# Patient Record
Sex: Male | Born: 1960 | Race: Black or African American | Hispanic: No
Health system: Southern US, Community
[De-identification: ages and names within clinical notes are randomized; demographics above are authoritative.]

## PROBLEM LIST (undated history)

## (undated) DIAGNOSIS — M75101 Unspecified rotator cuff tear or rupture of right shoulder, not specified as traumatic: Secondary | ICD-10-CM

## (undated) DIAGNOSIS — I7100 Dissection of unspecified site of aorta: Secondary | ICD-10-CM

## (undated) DIAGNOSIS — I509 Heart failure, unspecified: Secondary | ICD-10-CM

## (undated) DIAGNOSIS — E119 Type 2 diabetes mellitus without complications: Secondary | ICD-10-CM

## (undated) DIAGNOSIS — M12811 Other specific arthropathies, not elsewhere classified, right shoulder: Secondary | ICD-10-CM

## (undated) DIAGNOSIS — I10 Essential (primary) hypertension: Secondary | ICD-10-CM

## (undated) DIAGNOSIS — M75102 Unspecified rotator cuff tear or rupture of left shoulder, not specified as traumatic: Secondary | ICD-10-CM

## (undated) DIAGNOSIS — M5126 Other intervertebral disc displacement, lumbar region: Secondary | ICD-10-CM

## (undated) HISTORY — PX: CARDIAC SURGERY: SHX584

---

## 2021-01-06 ENCOUNTER — Emergency Department (HOSPITAL_COMMUNITY): Payer: Medicaid Other

## 2021-01-06 ENCOUNTER — Emergency Department (HOSPITAL_COMMUNITY)
Admission: EM | Admit: 2021-01-06 | Discharge: 2021-01-06 | Disposition: A | Discharge status: Home / Self Care | Payer: Medicaid Other | Attending: Emergency Medicine | Admitting: Emergency Medicine

## 2021-01-06 ENCOUNTER — Other Ambulatory Visit: Payer: Self-pay

## 2021-01-06 ENCOUNTER — Encounter (HOSPITAL_COMMUNITY): Payer: Self-pay

## 2021-01-06 DIAGNOSIS — E119 Type 2 diabetes mellitus without complications: Secondary | ICD-10-CM | POA: Insufficient documentation

## 2021-01-06 DIAGNOSIS — I110 Hypertensive heart disease with heart failure: Secondary | ICD-10-CM | POA: Diagnosis not present

## 2021-01-06 DIAGNOSIS — I509 Heart failure, unspecified: Secondary | ICD-10-CM | POA: Insufficient documentation

## 2021-01-06 DIAGNOSIS — M25511 Pain in right shoulder: Secondary | ICD-10-CM | POA: Diagnosis not present

## 2021-01-06 DIAGNOSIS — W19XXXA Unspecified fall, initial encounter: Secondary | ICD-10-CM | POA: Diagnosis not present

## 2021-01-06 HISTORY — DX: Other specific arthropathies, not elsewhere classified, right shoulder: M12.811

## 2021-01-06 HISTORY — DX: Heart failure, unspecified: I50.9

## 2021-01-06 HISTORY — DX: Essential (primary) hypertension: I10

## 2021-01-06 HISTORY — DX: Dissection of unspecified site of aorta: I71.00

## 2021-01-06 HISTORY — DX: Other intervertebral disc displacement, lumbar region: M51.26

## 2021-01-06 HISTORY — DX: Unspecified rotator cuff tear or rupture of right shoulder, not specified as traumatic: M75.102

## 2021-01-06 HISTORY — DX: Unspecified rotator cuff tear or rupture of right shoulder, not specified as traumatic: M75.101

## 2021-01-06 HISTORY — DX: Type 2 diabetes mellitus without complications: E11.9

## 2021-01-06 MED ORDER — METHOCARBAMOL 750 MG PO TABS: 750.0000 mg | ORAL_TABLET | Freq: Two times a day (BID) | ORAL | 0 refills | Status: AC | PRN

## 2021-01-06 MED ORDER — AMLODIPINE BESYLATE 5 MG PO TABS
10.0000 mg | ORAL_TABLET | Freq: Once | ORAL | Status: AC
  Administered 2021-01-06: 10 mg via ORAL
  Filled 2021-01-06: qty 2

## 2021-01-06 MED ORDER — CARVEDILOL 3.125 MG PO TABS
6.2500 mg | ORAL_TABLET | Freq: Once | ORAL | Status: AC
  Administered 2021-01-06: 6.25 mg via ORAL
  Filled 2021-01-06: qty 2

## 2021-01-06 MED ORDER — IBUPROFEN 200 MG PO TABS
600.0000 mg | ORAL_TABLET | Freq: Once | ORAL | Status: AC
  Administered 2021-01-06: 600 mg via ORAL
  Filled 2021-01-06: qty 3

## 2021-01-06 MED ORDER — DICLOFENAC SODIUM 1 % EX GEL: 4.0000 g | Freq: Four times a day (QID) | CUTANEOUS | 2 refills | Status: AC

## 2021-01-06 NOTE — ED Provider Notes (Signed)
[facility]-EMERGENCY DEPT Provider Note   CSN: [id number] Arrival date & time: 01/06/21  1217     History Chief Complaint  Patient presents with   Shoulder Pain    Dennis Henderson is a 60 y.o. male.  The history is provided by the patient.      Dennis Henderson is a 60 y.o. male, with a history of HTN, DM, rotator cuff tear bilaterally, presenting to the ED with right shoulder pain for the past month.  Patient states he fell about a month ago, was seen in the emergency department, had a clear x-ray of the humerus, but the shoulder was not imaged.  He endorses some recent worsening pain, aching, nonradiating, worse with movement of the right shoulder. He has tried Tylenol. He states he has known rotator cuff injury to the right shoulder.  Denies numbness, focal weakness, fever, chest pain, neck pain, shortness of breath, or any other complaints.   Past Medical History:  Diagnosis Date   Aortic dissection (HCC)    CHF (congestive heart failure) (HCC)    Diabetes mellitus without complication (HCC)    Hypertension    Lumbar herniated disc    Rotator cuff tear arthropathy of both shoulders     There are no problems to display for this patient.   Past Surgical History:  Procedure Laterality Date   CARDIAC SURGERY         History reviewed. No pertinent family history.     Home Medications Prior to Admission medications   Medication Sig Start Date End Date Taking? Authorizing Provider  diclofenac Sodium (VOLTAREN) 1 % GEL Apply 4 g topically 4 (four) times daily. 01/06/21  Yes Gyan Cambre C, PA-C  methocarbamol (ROBAXIN) 750 MG tablet Take 1 tablet (750 mg total) by mouth 2 (two) times daily as needed for muscle spasms (or muscle tightness). 01/06/21  Yes Nahdia Doucet C, PA-C    Allergies    Pork-derived products  Review of Systems   Review of Systems  Constitutional:  Negative for fever.  Respiratory:  Negative for shortness of breath.    Cardiovascular:  Negative for chest pain.  Musculoskeletal:  Positive for arthralgias. Negative for back pain and neck pain.  Neurological:  Negative for weakness and numbness.   Physical Exam Updated Vital Signs BP (!) 187/118 (BP Location: Left Arm)   Pulse 68   Temp 97.9 F (36.6 C) (Oral)   Resp 16   Ht 5\' 7"  (1.702 m)   Wt 81.2 kg   SpO2 99%   BMI 28.04 kg/m   Physical Exam Vitals and nursing note reviewed.  Constitutional:      General: He is not in acute distress.    Appearance: Normal appearance. He is well-developed. He is not diaphoretic.  HENT:     Head: Normocephalic and atraumatic.  Eyes:     Conjunctiva/sclera: Conjunctivae normal.  Cardiovascular:     Rate and Rhythm: Normal rate and regular rhythm.     Pulses:          Radial pulses are 2+ on the right side.  Pulmonary:     Effort: Pulmonary effort is normal.  Musculoskeletal:     Cervical back: Neck supple.     Comments: Pain and stiffness with range of motion of the right shoulder. He does have increased pain and difficulty with raising his right arm above his head.  He can touch the left shoulder with the right hand, though this is also accompanied with  pain and stiffness. No noted swelling, deformity, instability.  No other abnormalities noted in the upper extremity.  No tenderness, pain, swelling, or deformity in the neck or back.  Skin:    General: Skin is warm and dry.     Capillary Refill: Capillary refill takes less than 2 seconds.     Coloration: Skin is not pale.  Neurological:     Mental Status: He is alert.     Comments: Sensation grossly intact to light touch through each of the nerve distributions of the bilateral upper extremities. Abduction and adduction of the fingers intact against resistance. Grip strength equal bilaterally. Supination and pronation intact against resistance. Strength 5/5 through the cardinal directions of the bilateral wrists. Strength 5/5 with flexion and  extension of the bilateral elbows. Patient can touch the thumb to each one of the fingertips without difficulty.  Patient can hold the "OK" sign against resistance.  Psychiatric:        Behavior: Behavior normal.    ED Results / Procedures / Treatments   Labs (all labs ordered are listed, but only abnormal results are displayed) Labs Reviewed - No data to display  EKG None  Radiology DG Shoulder Right  Result Date: 01/06/2021 CLINICAL DATA:  Persistent right shoulder pain since fall a month ago. EXAM: RIGHT SHOULDER - 2+ VIEW COMPARISON:  None. FINDINGS: There is no evidence of fracture or dislocation. There is no evidence of arthropathy or other focal bone abnormality. Soft tissues are unremarkable. IMPRESSION: Negative. Electronically Signed   By: Obie Dredge M.D.   On: 01/06/2021 13:43    Procedures Procedures   Medications Ordered in ED Medications  ibuprofen (ADVIL) tablet 600 mg (has no administration in time range)  amLODipine (NORVASC) tablet 10 mg (10 mg Oral Given 01/06/21 1325)  carvedilol (COREG) tablet 6.25 mg (6.25 mg Oral Given 01/06/21 1325)    ED Course  I have reviewed the triage vital signs and the nursing notes.  Pertinent labs & imaging results that were available during my care of the patient were reviewed by me and considered in my medical decision making (see chart for details).    MDM Rules/Calculators/A&P                          Patient presents with worsened pain in the right shoulder following an injury.  Previously diagnosed rotator cuff injury as well. No findings concerning for acute vascular deficit or emergent neurologic dysfunction. X-ray reviewed and interpreted.  No acute abnormalities. The patient was given instructions for home care as well as return precautions. Patient voices understanding of these instructions, accepts the plan, and is comfortable with discharge.  Patient states he is visiting from [place].  We discussed him  obtaining orthopedic follow-up in [place], however, I did provide local follow-up as a precaution.  Patient states he has adequate blood pressure medication at home, however, requested doses of his medications while here in the ED.    Final Clinical Impression(s) / ED Diagnoses Final diagnoses:  Acute pain of right shoulder    Rx / DC Orders ED Discharge Orders          Ordered    methocarbamol (ROBAXIN) 750 MG tablet  2 times daily PRN        01/06/21 1352    diclofenac Sodium (VOLTAREN) 1 % GEL  4 times daily        01/06/21 1352  Anselm Pancoast, PA-C 01/06/21 1401    Alvira Monday, MD 01/06/21 2147

## 2021-01-06 NOTE — ED Triage Notes (Signed)
Pt reports falling about a month ago and hurt his right shoulder. Pt reports worsening pain in shoulder over the past month. Pt reports he has an X-ray of his arm back when he fell, but never got an X-ray of his shoulder.

## 2021-01-06 NOTE — Discharge Instructions (Addendum)
You have been seen today for a shoulder injury. There were no acute abnormalities on the x-rays, including no sign of fracture or dislocation, however, there could be injuries to the soft tissues, such as the ligaments or tendons that are not seen on xrays. There could also be what are called occult fractures that are small fractures not seen on xray. Antiinflammatory medications: Take 600 mg of ibuprofen every 6 hours or 440 mg (over the counter dose) to 500 mg (prescription dose) of naproxen every 12 hours for the next 3 days. After this time, these medications may be used as needed for pain. Take these medications with food to avoid upset stomach. Choose only one of these medications, do not take them together. Acetaminophen (generic for Tylenol): Should you continue to have additional pain while taking the ibuprofen or naproxen, you may add in acetaminophen as needed. Your daily total maximum amount of acetaminophen from all sources should be limited to 4000mg /day for persons without liver problems, or 2000mg /day for those with liver problems. Diclofenac gel: This is a topical anti-inflammatory medication and can be applied directly to the painful region.  Do not use on the face or genitals.  This medication may be used as an alternative to oral anti-inflammatory medications, such as ibuprofen or naproxen. Methocarbamol: Methocarbamol (generic for Robaxin) is a muscle relaxer and can help relieve stiff muscles or muscle spasms.  Do not drive or perform other dangerous activities while taking this medication as it can cause drowsiness as well as changes in reaction time and judgement. Ice: May apply ice to the area over the next 24 hours for 15 minutes at a time to reduce swelling. Elevation: Keep the extremity elevated as often as possible to reduce pain and inflammation. Support: Wear the sling for support and comfort. Wear this until pain resolves.  Be sure to rotate and move your shoulder throughout  the day out of the sling to avoid stiffness in the shoulder. Exercises: Start by performing these exercises a few times a week, increasing the frequency until you are performing them twice daily.  Follow up: Follow-up with an orthopedic specialist, ideally within the next 2 weeks for any further management.  Call to make an appointment. Return: Return to the ED for numbness, weakness, increasing pain, overall worsening symptoms, loss of function, or if symptoms are not improving, you have tried to follow up with the orthopedic specialist, and have been unable to do so.  For prescription assistance, may try using prescription discount sites or apps, such as goodrx.com or Good Rx smart phone app.

## 2021-01-14 ENCOUNTER — Emergency Department (HOSPITAL_COMMUNITY)
Admission: EM | Admit: 2021-01-14 | Discharge: 2021-01-14 | Disposition: A | Discharge status: Home / Self Care | Payer: Medicaid Other | Attending: Emergency Medicine | Admitting: Emergency Medicine

## 2021-01-14 ENCOUNTER — Emergency Department (HOSPITAL_COMMUNITY): Payer: Medicaid Other

## 2021-01-14 ENCOUNTER — Encounter (HOSPITAL_COMMUNITY): Payer: Self-pay

## 2021-01-14 ENCOUNTER — Other Ambulatory Visit: Payer: Self-pay

## 2021-01-14 DIAGNOSIS — Z794 Long term (current) use of insulin: Secondary | ICD-10-CM | POA: Insufficient documentation

## 2021-01-14 DIAGNOSIS — I509 Heart failure, unspecified: Secondary | ICD-10-CM | POA: Insufficient documentation

## 2021-01-14 DIAGNOSIS — R0789 Other chest pain: Secondary | ICD-10-CM | POA: Diagnosis not present

## 2021-01-14 DIAGNOSIS — R42 Dizziness and giddiness: Secondary | ICD-10-CM | POA: Diagnosis not present

## 2021-01-14 DIAGNOSIS — I110 Hypertensive heart disease with heart failure: Secondary | ICD-10-CM | POA: Diagnosis not present

## 2021-01-14 DIAGNOSIS — R0602 Shortness of breath: Secondary | ICD-10-CM | POA: Diagnosis not present

## 2021-01-14 DIAGNOSIS — E119 Type 2 diabetes mellitus without complications: Secondary | ICD-10-CM | POA: Diagnosis not present

## 2021-01-14 DIAGNOSIS — R61 Generalized hyperhidrosis: Secondary | ICD-10-CM | POA: Diagnosis not present

## 2021-01-14 DIAGNOSIS — R079 Chest pain, unspecified: Secondary | ICD-10-CM

## 2021-01-14 DIAGNOSIS — Z79899 Other long term (current) drug therapy: Secondary | ICD-10-CM | POA: Insufficient documentation

## 2021-01-14 DIAGNOSIS — R531 Weakness: Secondary | ICD-10-CM | POA: Insufficient documentation

## 2021-01-14 DIAGNOSIS — Z7984 Long term (current) use of oral hypoglycemic drugs: Secondary | ICD-10-CM | POA: Insufficient documentation

## 2021-01-14 DIAGNOSIS — I7102 Dissection of abdominal aorta: Secondary | ICD-10-CM | POA: Diagnosis not present

## 2021-01-14 LAB — CBC WITH DIFFERENTIAL/PLATELET
Abs Immature Granulocytes: 0.01 10*3/uL (ref 0.00–0.07)
Basophils Absolute: 0 10*3/uL (ref 0.0–0.1)
Basophils Relative: 0 %
Eosinophils Absolute: 0.2 10*3/uL (ref 0.0–0.5)
Eosinophils Relative: 2 %
HCT: 40.2 % (ref 39.0–52.0)
Hemoglobin: 13.7 g/dL (ref 13.0–17.0)
Immature Granulocytes: 0 %
Lymphocytes Relative: 19 %
Lymphs Abs: 1.3 10*3/uL (ref 0.7–4.0)
MCH: 31.4 pg (ref 26.0–34.0)
MCHC: 34.1 g/dL (ref 30.0–36.0)
MCV: 92 fL (ref 80.0–100.0)
Monocytes Absolute: 0.6 10*3/uL (ref 0.1–1.0)
Monocytes Relative: 8 %
Neutro Abs: 4.7 10*3/uL (ref 1.7–7.7)
Neutrophils Relative %: 71 %
Platelets: 254 10*3/uL (ref 150–400)
RBC: 4.37 MIL/uL (ref 4.22–5.81)
RDW: 12.6 % (ref 11.5–15.5)
WBC: 6.7 10*3/uL (ref 4.0–10.5)
nRBC: 0 % (ref 0.0–0.2)

## 2021-01-14 LAB — TROPONIN I (HIGH SENSITIVITY)
Troponin I (High Sensitivity): 9 ng/L (ref <18)
Troponin I (High Sensitivity): 9 ng/L (ref <18)
Troponin I (High Sensitivity): 10 ng/L (ref <18)

## 2021-01-14 LAB — COMPREHENSIVE METABOLIC PANEL
ALT: 16 U/L (ref 0–44)
AST: 16 U/L (ref 15–41)
Albumin: 3.3 g/dL — ABNORMAL LOW (ref 3.5–5.0)
Alkaline Phosphatase: 61 U/L (ref 38–126)
Anion gap: 6 (ref 5–15)
BUN: 10 mg/dL (ref 6–20)
CO2: 23 mmol/L (ref 22–32)
Calcium: 9.1 mg/dL (ref 8.9–10.3)
Chloride: 105 mmol/L (ref 98–111)
Creatinine, Ser: 1.21 mg/dL (ref 0.61–1.24)
GFR, Estimated: >60 mL/min (ref >60)
Glucose, Bld: 272 mg/dL — ABNORMAL HIGH (ref 70–99)
Potassium: 4.1 mmol/L (ref 3.5–5.1)
Sodium: 134 mmol/L — ABNORMAL LOW (ref 135–145)
Total Bilirubin: 1.1 mg/dL (ref 0.3–1.2)
Total Protein: 6.1 g/dL — ABNORMAL LOW (ref 6.5–8.1)

## 2021-01-14 MED ORDER — FENTANYL CITRATE (PF) 100 MCG/2ML IJ SOLN
50.0000 ug | Freq: Once | INTRAMUSCULAR | Status: AC
  Administered 2021-01-14: 50 ug via INTRAVENOUS
  Filled 2021-01-14: qty 2

## 2021-01-14 MED ORDER — INSULIN GLARGINE 100 UNIT/ML SOLOSTAR PEN
15.0000 [IU] | PEN_INJECTOR | Freq: Every day | SUBCUTANEOUS | 2 refills | Status: AC
  Filled 2021-01-14: qty 9, 60d supply, fill #0
  Filled 2021-01-17: qty 6, 40d supply, fill #0
  Filled 2021-01-17: qty 12, 80d supply, fill #0

## 2021-01-14 MED ORDER — IOHEXOL 350 MG/ML SOLN
100.0000 mL | Freq: Once | INTRAVENOUS | Status: AC | PRN
  Administered 2021-01-14: 100 mL via INTRAVENOUS

## 2021-01-14 NOTE — ED Provider Notes (Signed)
[facility]ENCY [facility] Provider Note   CSN: [id number] Arrival date & time: 01/14/21  1332     History No chief complaint on file.   Dennis GreenspanRodney Henderson is a 60 y.o. male who presents with concern for central chest pain that started 30 minutes prior to arrival in the emergency department when he was grilling at [place]out [place] in [place].  Pain does not radiate but does have significant associated shortness of breath.  Additionally his lightheadedness and generalized weakness as well as diaphoresis associated.  EMS was called and he was administered 324 of aspirin as well as 1 sublingual nitro with mild improvement in his pain.  I to my ischial exam he is sleeping in his bed, easily awoken by this provider with verbal stimuli.  History of aortic dissection with stent repair; patient does state that his current chest pain feels similar to when he initially had an aortic dissection though his vital signs are stable at this time.  I personally read this patient's medical records.  Additionally has recent diagnosis of diabetes mellitus on metformin and insulin, hypertension on Norvasc, and CHF.  Additionally chronically on Percocet for chronic back pain.  Denies any recent prolonged travel or immobilization, is not anticoagulated.  Denies any hormone replacement therapy.  HPI  HPI: A 60 year old patient with a history of treated diabetes and hypertension presents for evaluation of chest pain. Initial onset of pain was approximately 1-3 hours ago. The patient's chest pain is described as heaviness/pressure/tightness, is not worse with exertion and is relieved by nitroglycerin. The patient reports some diaphoresis. The patient's chest pain is middle- or left-sided, is not well-localized, is not sharp and does not radiate to the arms/jaw/neck. The patient does not complain of nausea. The patient has a family history of coronary artery disease in a first-degree relative with  onset less than age 58. The patient has no history of stroke, has no history of peripheral artery disease, has not smoked in the past 90 days, has no history of hypercholesterolemia and does not have an elevated BMI (>=30).   Past Medical History:  Diagnosis Date   Aortic dissection (HCC)    CHF (congestive heart failure) (HCC)    Diabetes mellitus without complication (HCC)    Hypertension    Lumbar herniated disc    Rotator cuff tear arthropathy of both shoulders     There are no problems to display for this patient.   Past Surgical History:  Procedure Laterality Date   CARDIAC SURGERY      No family history on file.     Home Medications Prior to Admission medications   Medication Sig Start Date End Date Taking? Authorizing Provider  amLODipine (NORVASC) 10 MG tablet Take 10 mg by mouth every morning. 10/27/20  Yes [provider]  brimonidine (ALPHAGAN) 0.2 % ophthalmic solution Place 1 drop into both eyes every morning.   Yes [provider]  carvedilol (COREG) 25 MG tablet Take 25 mg by mouth every morning. 12/26/20  Yes [provider]  diclofenac Sodium (VOLTAREN) 1 % GEL Apply 4 g topically 4 (four) times daily. Patient taking differently: Apply 4 g topically 3 (three) times daily as needed (pain). 01/06/21  Yes Joy, Shawn C, PA-C  insulin glargine (LANTUS) 100 unit/mL SOPN Inject 15 Units into the skin at bedtime.   Yes [provider]  insulin glargine (LANTUS) 100 unit/mL SOPN Inject 15 Units into the skin at bedtime. 01/14/21  Yes Aaima Gaddie, Lupe Carneyebekah  R, PA-C  metFORMIN (GLUCOPHAGE) 500 MG tablet Take 1,000 mg by mouth 2 (two) times daily. 12/23/20  Yes [provider]  methocarbamol (ROBAXIN) 750 MG tablet Take 1 tablet (750 mg total) by mouth 2 (two) times daily as needed for muscle spasms (or muscle tightness). Patient taking differently: Take 750 mg by mouth at bedtime. 01/06/21  Yes Joy, Shawn C, PA-C  Multiple Vitamin  (MULTIVITAMIN WITH MINERALS) TABS tablet Take 1 tablet by mouth every morning. Centrum   Yes [provider]  oxyCODONE-acetaminophen (PERCOCET) 10-325 MG tablet Take 1 tablet by mouth 3 (three) times daily as needed for pain. 12/26/20  Yes [provider]  timolol (TIMOPTIC) 0.5 % ophthalmic solution Place 1 drop into both eyes every morning.   Yes [provider]  zolpidem (AMBIEN) 10 MG tablet Take 10 mg by mouth at bedtime. 11/25/20  Yes [provider]   Allergies    Pork-derived products  Review of Systems   Review of Systems  Constitutional:  Positive for diaphoresis and fatigue.  HENT: Negative.    Eyes: Negative.   Respiratory:  Positive for chest tightness and shortness of breath.   Cardiovascular:  Positive for chest pain and palpitations. Negative for leg swelling.  Gastrointestinal: Negative.   Genitourinary: Negative.   Musculoskeletal: Negative.   Neurological:  Positive for dizziness, weakness, light-headedness and headaches. Negative for syncope.   Physical Exam Updated Vital Signs BP 136/85   Pulse 72   Temp 98.6 F (37 C) (Oral)   Resp 14   Ht 5\' 7"  (1.702 m)   Wt 81.2 kg   SpO2 97%   BMI 28.04 kg/m   Physical Exam Vitals and nursing note reviewed.  Constitutional:      Appearance: Normal appearance. He is not toxic-appearing or diaphoretic.  HENT:     Head: Normocephalic and atraumatic.     Nose: Nose normal.     Mouth/Throat:     Mouth: Mucous membranes are moist.     Pharynx: Oropharynx is clear. Uvula midline. No oropharyngeal exudate or posterior oropharyngeal erythema.     Tonsils: No tonsillar exudate.  Eyes:     General: Lids are normal. Vision grossly intact.        Right eye: No discharge.        Left eye: No discharge.     Extraocular Movements: Extraocular movements intact.     Conjunctiva/sclera: Conjunctivae normal.     Pupils: Pupils are equal, round, and reactive to light.  Neck:     Vascular: No  carotid bruit.     Trachea: Trachea and phonation normal.  Cardiovascular:     Rate and Rhythm: Normal rate. Rhythm regularly irregular.     Pulses: Normal pulses.     Heart sounds: Normal heart sounds. No murmur heard.    Comments: Bigeminy on the monitor Pulmonary:     Effort: Pulmonary effort is normal. No tachypnea, bradypnea, accessory muscle usage, prolonged expiration or respiratory distress.     Breath sounds: Normal breath sounds. No wheezing or rales.  Chest:     Chest wall: No mass, lacerations, deformity, swelling, tenderness, crepitus or edema.  Abdominal:     General: Bowel sounds are normal. There is no distension.     Palpations: Abdomen is soft.     Tenderness: There is no abdominal tenderness. There is no right CVA tenderness, left CVA tenderness, guarding or rebound.  Musculoskeletal:        General: No deformity.  Cervical back: Full passive range of motion without pain, normal range of motion and neck supple. No rigidity or crepitus. No pain with movement or spinous process tenderness.     Right lower leg: No edema.     Left lower leg: No edema.  Lymphadenopathy:     Cervical: No cervical adenopathy.  Skin:    General: Skin is warm and dry.     Capillary Refill: Capillary refill takes less than 2 seconds.  Neurological:     General: No focal deficit present.     Mental Status: He is alert and oriented to person, place, and time. Mental status is at baseline.     Sensory: Sensation is intact.     Motor: Motor function is intact.  Psychiatric:        Mood and Affect: Mood normal.    ED Results / Procedures / Treatments   Labs (all labs ordered are listed, but only abnormal results are displayed) Labs Reviewed  COMPREHENSIVE METABOLIC PANEL - Abnormal; Notable for the following components:      Result Value   Sodium 134 (*)    Glucose, Bld 272 (*)    Total Protein 6.1 (*)    Albumin 3.3 (*)    All other components within normal limits  CBC WITH  DIFFERENTIAL/PLATELET  TROPONIN I (HIGH SENSITIVITY)  TROPONIN I (HIGH SENSITIVITY)  TROPONIN I (HIGH SENSITIVITY)    EKG EKG Interpretation  Date/Time:  Saturday January 14 2021 13:41:49 EDT Ventricular Rate:  80 PR Interval:  154 QRS Duration: 110 QT Interval:  384 QTC Calculation: 442 R Axis:   -32 Text Interpretation: Sinus rhythm with Premature atrial complexes in a pattern of bigeminy Left axis deviation Moderate voltage criteria for LVH, may be normal variant ( R in aVL , Cornell product ) Cannot rule out Anterior infarct , age undetermined Abnormal ECG Confirmed by Marianna Fuss ([id number]) on 01/14/2021 3:05:38 PM  Radiology DG Chest 2 View  Result Date: 01/14/2021 CLINICAL DATA:  Chest pain and shortness of breath. EXAM: CHEST - 2 VIEW COMPARISON:  None. FINDINGS: The lungs are clear without focal pneumonia, edema, pneumothorax or pleural effusion. Tiny nodule projects over the anterior left second rib, potentially granuloma. The cardiopericardial silhouette is within normal limits for size. Thoracic aortic stent graft noted. The visualized bony structures of the thorax show no acute abnormality. IMPRESSION: 1. No acute cardiopulmonary findings. 2. Tiny nodule left upper lobe, possibly granuloma. Comparison to prior imaging would be the most helpful next step to determine chronicity. In the absence of available prior imaging, chest CT without contrast could be used to further evaluate. Electronically Signed   By: Kennith Center M.D.   On: 01/14/2021 14:24   CT Angio Chest/Abd/Pel for Dissection W and/or W/WO  Result Date: 01/14/2021 CLINICAL DATA:  Chest and back pain. EXAM: CT ANGIOGRAPHY CHEST, ABDOMEN AND PELVIS TECHNIQUE: Non-contrast CT of the chest was initially obtained. Multidetector CT imaging through the chest, abdomen and pelvis was performed using the standard protocol during bolus administration of intravenous contrast. Multiplanar reconstructed images and MIPs were obtained  and reviewed to evaluate the vascular anatomy. CONTRAST:  OMNIPAQUE IOHEXOL 350 MG/ML SOLN COMPARISON:  None. FINDINGS: CTA CHEST FINDINGS Cardiovascular: Status post stent graft repair involving transverse aortic arch and proximal descending thoracic aorta. No aneurysm or dissection is noted currently. Normal cardiac size. No pericardial effusion. Great vessels are widely patent without significant stenosis. Mediastinum/Nodes: No enlarged mediastinal, hilar, or axillary lymph nodes. Thyroid gland,  trachea, and esophagus demonstrate no significant findings. Lungs/Pleura: Lungs are clear. No pleural effusion or pneumothorax. Musculoskeletal: No chest wall abnormality. No acute or significant osseous findings. Review of the MIP images confirms the above findings. CTA ABDOMEN AND PELVIS FINDINGS VASCULAR Aorta: There is a dissection along the left side of the suprarenal abdominal aorta with fenestration with true lumen in its most inferior portion. No aneurysm is noted. 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 noted at its origin with poststenotic dilatation. No thrombus is noted. SMA: Patent without evidence of aneurysm, dissection, vasculitis or significant stenosis. Arises from true lumen. Renals: Both renal arteries are patent without evidence of aneurysm, dissection, vasculitis, fibromuscular dysplasia or significant stenosis. Right renal artery arises from true lumen. Left renal artery arises from false lumen. IMA: Patent without evidence of aneurysm, dissection, vasculitis or significant stenosis. Inflow: Patent without evidence of aneurysm, dissection, vasculitis or significant stenosis. Veins: No obvious venous abnormality within the limitations of this arterial phase study. Review of the MIP images confirms the above findings. NON-VASCULAR Hepatobiliary: No focal liver abnormality is seen. Status post cholecystectomy. No biliary dilatation. Pancreas: Unremarkable. No pancreatic  ductal dilatation or surrounding inflammatory changes. Spleen: Normal in size without focal abnormality. Adrenals/Urinary Tract: Adrenal glands appear normal. Left renal cysts are noted. No hydronephrosis or renal obstruction is noted. Urinary bladder is unremarkable. Stomach/Bowel: Stomach is within normal limits. Appendix appears normal. No evidence of bowel wall thickening, distention, or inflammatory changes. Lymphatic: No adenopathy is noted. Reproductive: Prostate is unremarkable. Other: No abdominal wall hernia or abnormality. No abdominopelvic ascites. Musculoskeletal: Sclerotic densities are noted in the L2 vertebral body. Sclerotic density is also noted in the right iliac wing. Review of the MIP images confirms the above findings. IMPRESSION: Dissection is seen involving the left side of the suprarenal abdominal aorta with fenestration connecting to true lumen and is most inferior portion. The celiac and left renal artery are seen 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 Status post stent graft repair of transverse aortic arch and proximal descending thoracic aorta. No aneurysm or dissection is seen involving the thoracic aorta currently. Sclerotic densities are noted in the L2 vertebral body as well as the right iliac wing. Metastatic disease cannot be excluded, and further evaluation with bone scan is recommended. Electronically Signed   By: Lupita Raider M.D.   On: 01/14/2021 16:50    Procedures Procedures   Medications Ordered in ED Medications  fentaNYL (SUBLIMAZE) injection 50 mcg (50 mcg Intravenous Given 01/14/21 1659)  iohexol (OMNIPAQUE) 350 MG/ML injection 100 mL (100 mLs Intravenous Contrast Given 01/14/21 1624)    ED Course  I have reviewed the triage vital signs and the nursing notes.  Pertinent labs & imaging results that were available during my care of the patient were reviewed by me and considered in my  medical decision making (see chart for details).  Clinical Course as of 01/14/21 2159  Sat Jan 14, 2021  1740 CTA discussed with radiologist, there was not the radiologist to read the final report.  Case discussed for confirmation that image appears stable.  While radiologist cannot view images as performed at outside facilities, was able to compare prior read from September 2021 to today's image, and as best can be deduced from comparison to read today's images, image does appear stable.  There is no propagation of the dissection into the pelvis.  I appreciate his collaboration  with care of this patient for this reassuring read. [RS]  1901 Consult with cardiologist, Dr. Molli Hazard, who recommends proceeding with third troponin.  If Theodis Aguas remains flat, patient may be discharged with close outpatient follow-up, if rising should be admitted for observation.  I appreciate his collaboration of care of this patient. [RS]    Clinical Course User Index [RS] Yasheka Fossett, Eugene Gavia, PA-C   MDM Rules/Calculators/A&P HEAR Score: 8                       60 year old male history of hypertension, diabetes, and aortic dissection having undergone thoracic aorta repair with stent, who presents with concern for centralized chest pain.  Pain not exertional in nature, does not radiate.  Differential diagnosis includes but is not limited to progression or recurrence of aortic dissection or other vascular dissection of the chest, pneumonia, ACS, dysrhythmia, pleural effusion, PE.  Vital signs are normal on intake.  Cardiopulmonary exam is significant only for irregularly irregular rhythm with bigeminy on the monitor.  Abdominal exam is benign, no pulsatile mass.  Patient is neurovascularly intact in all 4 extremities.  CBC is unremarkable, CMP with hyperglycemia and very mild hyponatremia 134.  Initial troponin negative, 9. Thoracic aortic graft in place on CXR, tiny nodule LUL. EKG with NSR with bigeminy.  Given history  and patient's reported similarity in pain to his initial dissection, will proceed with dissection study.  CTA stable, as reviewed by the provider with the radiologist detailed plan.  Patient remains hemodynamically stable, that he is continue to have 3/10 central chest pain/pressure without shortness of breath.  Will consult cardiology as heart score is elevated to 6, for disposition recommendations.  Consult to cardiology as above who requested third troponin.  Repeat troponin is negative, 10.  Given troponins that are flat x3, as well as improvement in chest pain, no further work-up is warranted in ED this time.  Will provide information for close outpatient cardiology follow-up and recommend patient follow-up closely with his PCP.  Patient concerned regarding short supply of insulin, only has 1 more dose and is supposedly unable to fill his prescription again until 6/13.  TOC consult placed, patient informed to expect a call from social work early next week.  We will continue to monitor CBGs at home.  Not concerning the hyperglycemic in the emergency department today.  Iris voiced understanding of his medical evaluation and treatment plan.  Each of his questions was answered to his expressed satisfaction.  Strict return precautions given.  Patient is well-appearing, stable, and appropriate for discharge at this time.  This chart was dictated using voice recognition software, Dragon. Despite the best efforts of this provider to proofread and correct errors, errors may still occur which can change documentation meaning.  Final Clinical Impression(s) / ED Diagnoses Final diagnoses:  Chest pain, unspecified type    Rx / DC Orders ED Discharge Orders          Ordered    insulin glargine (LANTUS) 100 unit/mL SOPN  Daily at bedtime        01/14/21 2157             Kedron Uno, Eugene Gavia, PA-C 01/14/21 2159    Milagros Loll, MD 01/15/21 1547

## 2021-01-14 NOTE — ED Notes (Signed)
Patient verbalizes understanding of discharge instructions. Prescriptions and follow-up care reviewed. Opportunity for questioning and answers were provided. Armband removed by staff, pt discharged from ED ambulatory.  

## 2021-01-14 NOTE — Discharge Instructions (Addendum)
Please follow-up with your cardiologist and your primary care doctor.  If you do not have an established cardiologist, you may contact the number provided to follow-up with one of our cardiologist.  Your CT scan was very reassuring.  There is no signs of progression of your aortic dissection, which is very good news.  Please do establish care, the cardiologist listed below.  Please inform the need to be seen for emergency room follow-up.  Additionally regarding your insulin, you should be receiving a phone call from our social work team.  A short term prescription has been sent to the [facility], listed below.

## 2021-01-14 NOTE — ED Notes (Signed)
Meal given awaiting consult to cardiology

## 2021-01-14 NOTE — TOC Initial Note (Signed)
Transition of Care [facility]) - Initial/Assessment Note    Patient Details  Name: Dennis Henderson MRN: [id number] Date of Birth: 1961/06/23  [facility] of Care [facility]) CM/SW Contact:    Lockie Pares, RN Phone Number: 01/14/2021, 6:03 PM  Clinical Narrative:                  Patient states he doesn't have enough insulin, refill not due until 7/13 and needs medication assistance. Patient has Medicaid, probable admission for vascular issues.  CM will follow. Recommend Diabetes educator to see patient.  Expected Discharge Plan: Home/Self Care Barriers to Discharge: Continued Medical Work up, ED Medication assistance (patient states  he doesnt have enough insulin)   Patient Goals and CMS Choice        Expected Discharge Plan and Services Expected Discharge Plan: Home/Self Care                                              Prior Living Arrangements/Services                       Activities of Daily Living      Permission Sought/Granted                  Emotional Assessment              Admission diagnosis:  CP There are no problems to display for this patient.  PCP:  System, Provider Not In Pharmacy:   [facility] [place], [place] - [address] [facility] [address] [place] [address] Phone: [phone] Fax: [phone]     Social Determinants of Health (SDOH) Interventions    Readmission Risk Interventions No flowsheet data found.

## 2021-01-14 NOTE — ED Notes (Signed)
Patient is resting comfortably. 

## 2021-01-14 NOTE — ED Provider Notes (Signed)
Emergency Medicine Provider Triage Evaluation Note  Dennis Henderson , a 60 y.o. male  was evaluated in triage.  Pt complains of chest pain.  Chest pain started 30 minutes prior to arrival in the emergency department.  Pain is midsternal and does not radiate.  Pain is described as a pressure.  Patient endorses associated diaphoresis, shortness of breath, nausea.  Pain started while he was at a cookout.  Patient endorsed feeling lightheaded.  Patient was given 324 mg of aspirin and 1 sublingual nitro with EMS.  Patient endorses some relief with these treatments.  Review of Systems  Positive: Chest pain, shortness of breath, diaphoresis, nausea Negative: Palpitations, leg swelling, numbness, weakness   Physical Exam  BP 119/70 (BP Location: Right Arm)   Pulse (!) 40   Temp 98.6 F (37 C) (Oral)   Resp 14   SpO2 99%  Gen:   Awake, no distress   Resp:  Normal effort  MSK:   Moves extremities without difficulty  Other:  +2 carotid pulse bilaterally, +2 radial pulse bilaterally,   Medical Decision Making  Medically screening exam initiated at 1:57 PM.  Appropriate orders placed.  Urban Naval was informed that the remainder of the evaluation will be completed by another provider, this initial triage assessment does not replace that evaluation, and the importance of remaining in the ED until their evaluation is complete.  Patient is level 2 and will  get moved back to her room as soon as possible.   Haskel Schroeder, PA-C 01/14/21 1400    Koleen Distance, MD 01/14/21 1723

## 2021-01-14 NOTE — ED Notes (Signed)
Vital signs stable. 

## 2021-01-14 NOTE — ED Triage Notes (Signed)
Patient arrived by [facility] after developing chest pain while grilling today. Patient became light headed and received 500 NS prior to asa. Patient received asa and 1 sl ntg pta. With some relief cbg 289

## 2021-01-17 ENCOUNTER — Other Ambulatory Visit: Payer: Self-pay

## 2021-03-14 ENCOUNTER — Telehealth: Payer: Self-pay

## 2021-03-14 ENCOUNTER — Ambulatory Visit (HOSPITAL_BASED_OUTPATIENT_CLINIC_OR_DEPARTMENT_OTHER): Payer: Medicaid Other | Admitting: Cardiology

## 2021-03-14 NOTE — Telephone Encounter (Signed)
Pt received a letter regarding an incidental finding on  CT dated 01/14/21. Discussed with pt about L2 "Sclerotic densities are noted in the L2 vertebral body as well as the right iliac wing. Metastatic disease cannot be excluded, and further evaluation with bone scan is recommended"  Pt verbalized understanding.

## 2021-03-31 ENCOUNTER — Other Ambulatory Visit: Payer: Self-pay

## 2021-04-05 ENCOUNTER — Encounter: Payer: Self-pay | Admitting: *Deleted

## 2023-03-27 IMAGING — CT CT ANGIO CHEST-ABD-PELV FOR DISSECTION W/ AND WO/W CM
2 of 7 series · 13 of 46 positions shown, 15 images · IV contrast (OMNI 350)
Comparison: None.

CLINICAL DATA: Chest and back pain.

EXAM:
CT ANGIOGRAPHY CHEST, ABDOMEN AND PELVIS
TECHNIQUE: Non-contrast CT of the chest was initially obtained.

[Series 6: dissection 2mm · axial · 0.78mm/px · z∈[+821,+1411]mm · 10 of 329 slices shown, 12 images]
[im 17/329  soft-tissue]
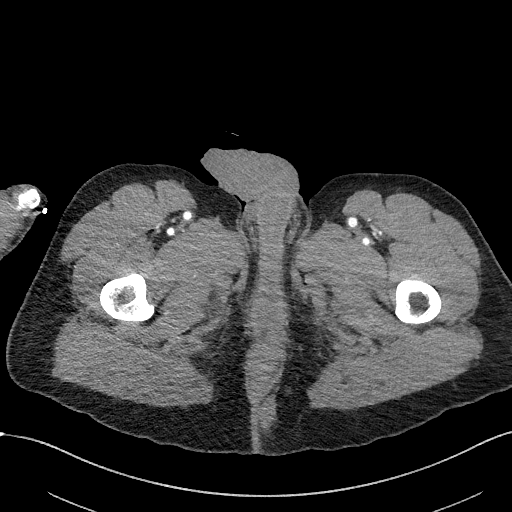
[im 17/329  bone]
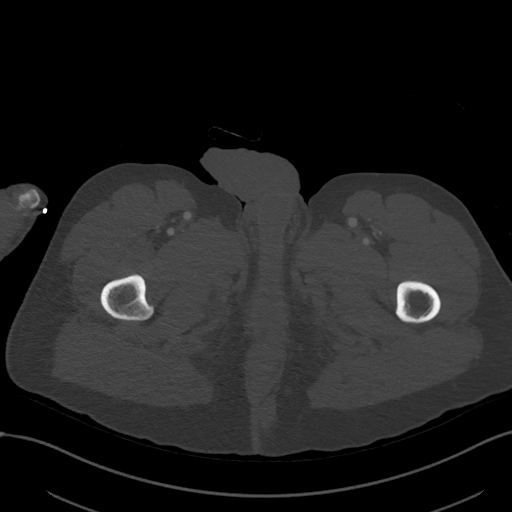
[im 50/329  soft-tissue]
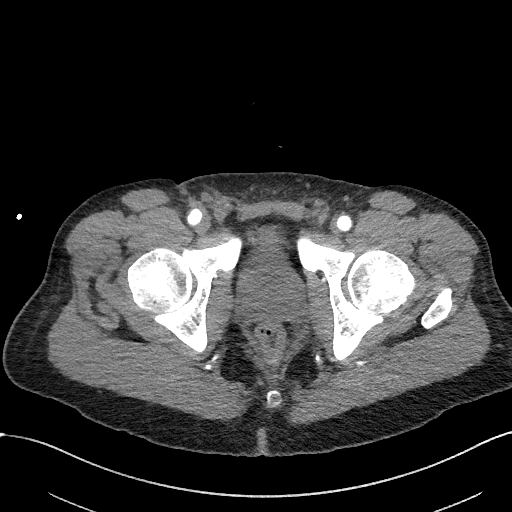
[im 83/329  soft-tissue]
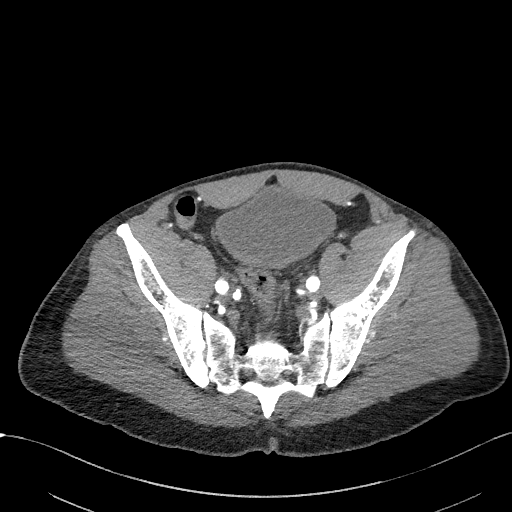
[im 115/329  soft-tissue]
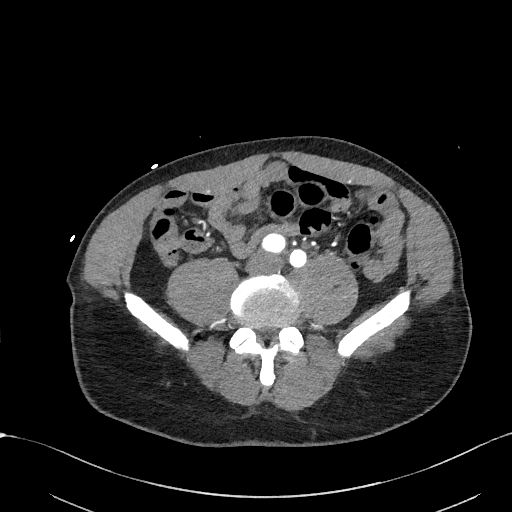
[im 148/329  soft-tissue]
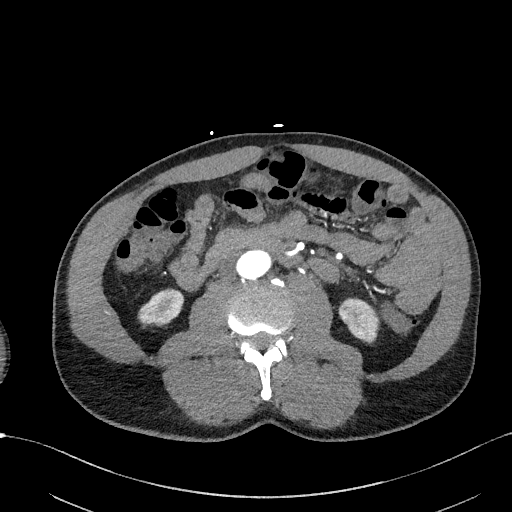
[im 181/329  soft-tissue]
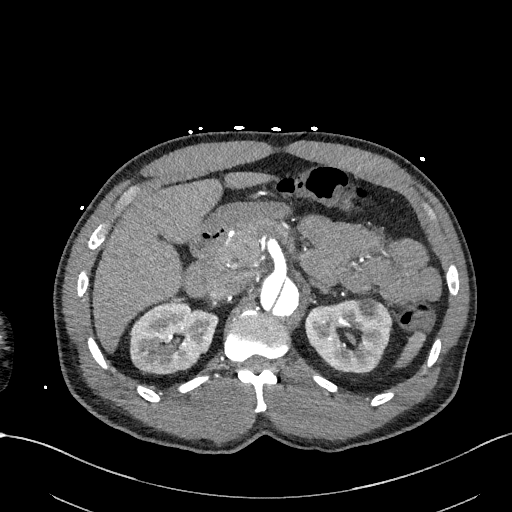
[im 214/329  soft-tissue]
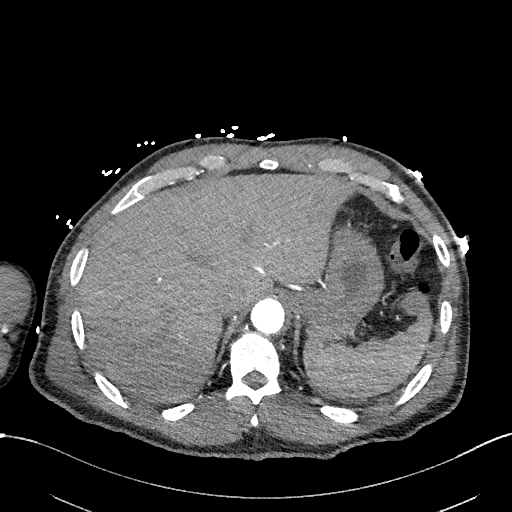
[im 246/329  soft-tissue]
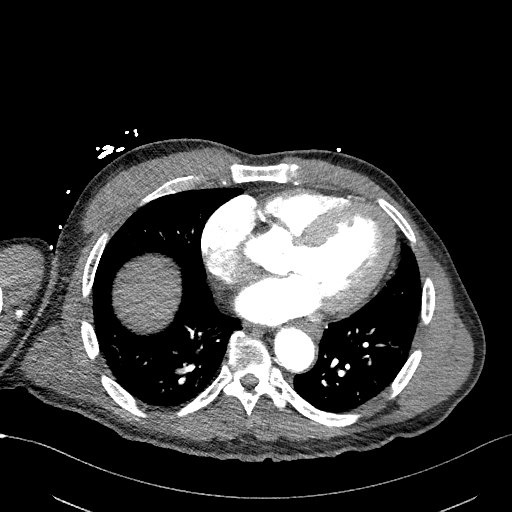
[im 279/329  soft-tissue]
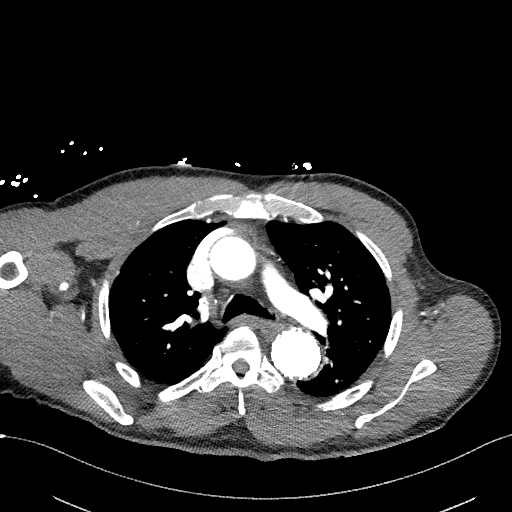
[im 279/329  bone]
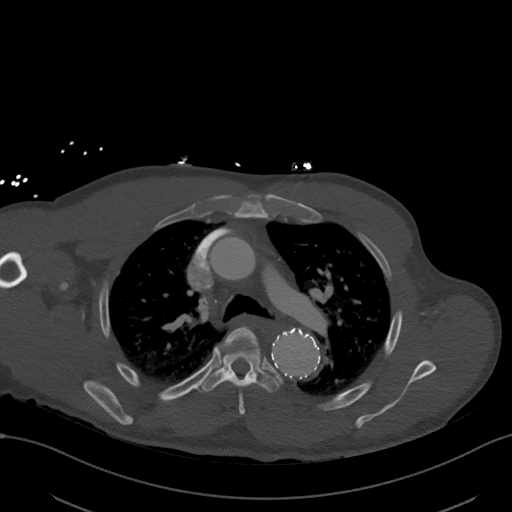
[im 312/329  soft-tissue]
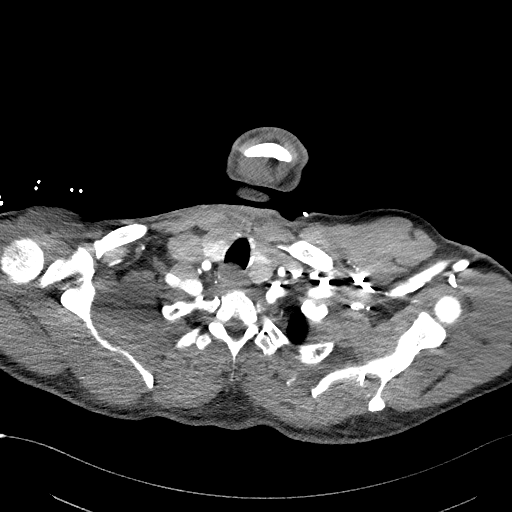

[Series 10: dissection 2mm cor · coronal · 0.78mm/px · 3 of 151 slices shown]
[im 38/151  soft-tissue]
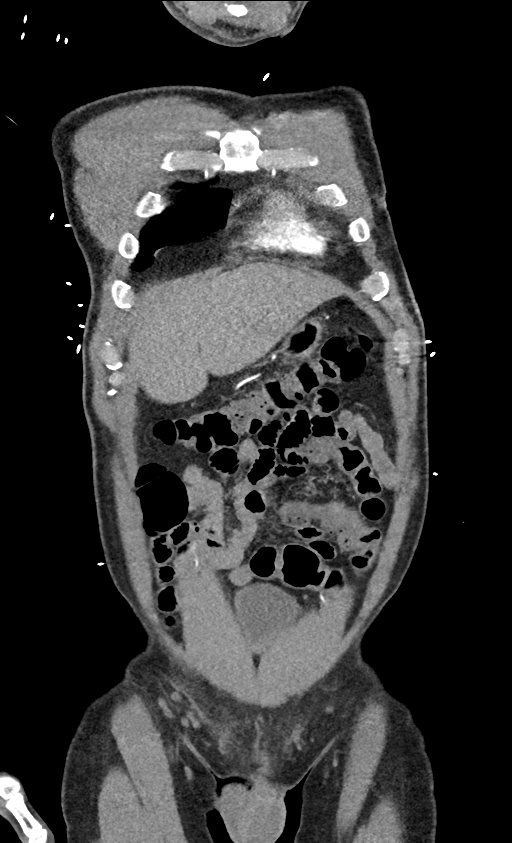
[im 76/151  soft-tissue]
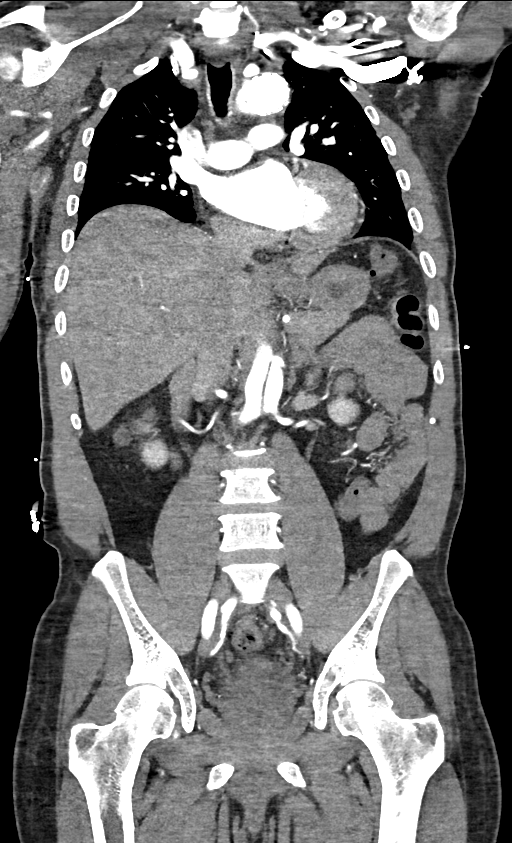
[im 113/151  soft-tissue]
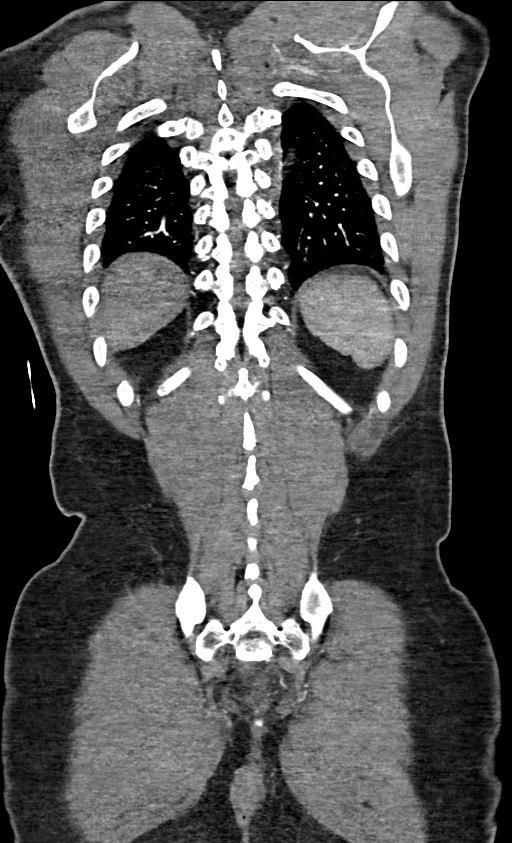

[13 of 46 positions shown; findings below may reference images not displayed]

Multidetector CT imaging through the chest, abdomen and pelvis was
performed using the standard protocol during bolus administration of
intravenous contrast. Multiplanar reconstructed images and MIPs were
obtained and reviewed to evaluate the vascular anatomy.

CONTRAST:  100mL OMNIPAQUE IOHEXOL 350 MG/ML SOLN
FINDINGS: CTA CHEST FINDINGS

Cardiovascular: Status post stent graft repair involving transverse
aortic arch and proximal descending thoracic aorta. No aneurysm or
dissection is noted currently. Normal cardiac size. No pericardial
effusion. Great vessels are widely patent without significant
stenosis.

Mediastinum/Nodes: No enlarged mediastinal, hilar, or axillary lymph
nodes. Thyroid gland, trachea, and esophagus demonstrate no
significant findings.

Lungs/Pleura: Lungs are clear. No pleural effusion or pneumothorax.

Musculoskeletal: No chest wall abnormality. No acute or significant
osseous findings.

Review of the MIP images confirms the above findings.

CTA ABDOMEN AND PELVIS FINDINGS

VASCULAR

Aorta: There is a dissection along the left side of the suprarenal
abdominal aorta with fenestration with true lumen in its most
inferior portion. No aneurysm is noted.

Celiac: Arises from false lumen. Moderate to severe narrowing is
noted at its origin with poststenotic dilatation. No thrombus is
noted.

SMA: Patent without evidence of aneurysm, dissection, vasculitis or
significant stenosis. Arises from true lumen.

Renals: Both renal arteries are patent without evidence of aneurysm,
dissection, vasculitis, fibromuscular dysplasia or significant
stenosis. Right renal artery arises from true lumen. Left renal
artery arises from false lumen.

IMA: Patent without evidence of aneurysm, dissection, vasculitis or
significant stenosis.

Inflow: Patent without evidence of aneurysm, dissection, vasculitis
or significant stenosis.

Veins: No obvious venous abnormality within the limitations of this
arterial phase study.

Review of the MIP images confirms the above findings.

NON-VASCULAR

Hepatobiliary: No focal liver abnormality is seen. Status post
cholecystectomy. No biliary dilatation.

Pancreas: Unremarkable. No pancreatic ductal dilatation or
surrounding inflammatory changes.

Spleen: Normal in size without focal abnormality.

Adrenals/Urinary Tract: Adrenal glands appear normal. Left renal
cysts are noted. No hydronephrosis or renal obstruction is noted.
Urinary bladder is unremarkable.

Stomach/Bowel: Stomach is within normal limits. Appendix appears
normal. No evidence of bowel wall thickening, distention, or
inflammatory changes.

Lymphatic: No adenopathy is noted.

Reproductive: Prostate is unremarkable.

Other: No abdominal wall hernia or abnormality. No abdominopelvic
ascites.

Musculoskeletal: Sclerotic densities are noted in the L2 vertebral
body. Sclerotic density is also noted in the right iliac wing.

Review of the MIP images confirms the above findings.
IMPRESSION: Dissection is seen involving the left side of the suprarenal
abdominal aorta with fenestration connecting to true lumen and is
most inferior portion. The celiac and left renal artery are seen
arising from the false lumen. Moderate to severe narrowing is seen
involving the origin of the celiac artery.

Status post stent graft repair of transverse aortic arch and
proximal descending thoracic aorta. No aneurysm or dissection is
seen involving the thoracic aorta currently.

Sclerotic densities are noted in the L2 vertebral body as well as
the right iliac wing. Metastatic disease cannot be excluded, and
further evaluation with bone scan is recommended.
